# Patient Record
Sex: Male | Born: 1980 | Race: Black or African American | Hispanic: No | Marital: Married | Smoking: Current every day smoker
Health system: Southern US, Community
[De-identification: ages and names within clinical notes are randomized; demographics above are authoritative.]

## PROBLEM LIST (undated history)

## (undated) DIAGNOSIS — J302 Other seasonal allergic rhinitis: Secondary | ICD-10-CM

## (undated) HISTORY — PX: NOSE SURGERY: SHX723

---

## 2004-10-12 ENCOUNTER — Emergency Department: Payer: Self-pay | Admitting: Emergency Medicine

## 2016-12-02 ENCOUNTER — Encounter: Payer: Self-pay | Admitting: Emergency Medicine

## 2016-12-02 ENCOUNTER — Emergency Department
Admission: EM | Admit: 2016-12-02 | Discharge: 2016-12-02 | Disposition: A | Discharge status: Home / Self Care | Payer: Self-pay | Attending: Emergency Medicine | Admitting: Emergency Medicine

## 2016-12-02 DIAGNOSIS — K047 Periapical abscess without sinus: Secondary | ICD-10-CM | POA: Insufficient documentation

## 2016-12-02 DIAGNOSIS — F17210 Nicotine dependence, cigarettes, uncomplicated: Secondary | ICD-10-CM | POA: Insufficient documentation

## 2016-12-02 DIAGNOSIS — K029 Dental caries, unspecified: Secondary | ICD-10-CM | POA: Insufficient documentation

## 2016-12-02 HISTORY — DX: Other seasonal allergic rhinitis: J30.2

## 2016-12-02 MED ORDER — PENICILLIN V POTASSIUM 500 MG PO TABS: 500.0000 mg | ORAL_TABLET | Freq: Four times a day (QID) | ORAL | 0 refills | Status: DC

## 2016-12-02 MED ORDER — TRAMADOL HCL 50 MG PO TABS: 50.0000 mg | ORAL_TABLET | Freq: Two times a day (BID) | ORAL | 0 refills | Status: AC

## 2016-12-02 MED ORDER — TRAMADOL HCL 50 MG PO TABS
50.0000 mg | ORAL_TABLET | Freq: Once | ORAL | Status: AC
  Administered 2016-12-02: 50 mg via ORAL
  Filled 2016-12-02: qty 1

## 2016-12-02 MED ORDER — PENICILLIN V POTASSIUM 500 MG PO TABS
500.0000 mg | ORAL_TABLET | Freq: Once | ORAL | Status: AC
  Administered 2016-12-02: 500 mg via ORAL
  Filled 2016-12-02: qty 1

## 2016-12-02 NOTE — ED Provider Notes (Signed)
[facility] Emergency Department Provider Note ____________________________________________  Time seen: 1833  I have reviewed the triage vital signs and the nursing notes.  HISTORY  Chief Complaint  Otalgia and Jaw Pain   HPI Jerome Stewart is a 36 y.o. male visits to the ED, for evaluation of left ear and jaw pain started 2 days prior. The patient is unclear whether he has an infection in his ear on abscess in his teeth. He does admit to poor dentition, and notes that he has several broken molars on the left lower jaw. He denies any interim fevers, chills, or sweats. He is able to eat and drink without difficulty, but does endorse increased pain to the left jaw and into the ear.  Past Medical History:  Diagnosis Date  . Seasonal allergies     There are no active problems to display for this patient.   Past Surgical History:  Procedure Laterality Date  . NOSE SURGERY      Prior to Admission medications   Medication Sig Start Date End Date Taking? Authorizing Provider  penicillin v potassium (VEETID) 500 MG tablet Take 1 tablet (500 mg total) by mouth 4 (four) times daily. 12/02/16   Phiona Ramnauth V Bacon Dorann Davidson, PA-C  traMADol (ULTRAM) 50 MG tablet Take 1 tablet (50 mg total) by mouth 2 (two) times daily. 12/02/16   Charlesetta Ivory Lani Mendiola, PA-C    Allergies Patient has no known allergies.  No family history on file.  Social History Social History  Substance Use Topics  . Smoking status: Current Every Day Smoker    Packs/day: 1.00  . Smokeless tobacco: Never Used  . Alcohol use No    Review of Systems  Constitutional: Negative for fever. Eyes: Negative for visual changes. ENT: Negative for sore throat. Dental pain on the left. Left otalgia. Cardiovascular: Negative for chest pain. Respiratory: Negative for shortness of breath. Gastrointestinal: Negative for abdominal pain, vomiting and diarrhea. Skin: Negative for rash. Neurological:  Negative for headaches, focal weakness or numbness. ____________________________________________  PHYSICAL EXAM:  VITAL SIGNS: ED Triage Vitals  Enc Vitals Group     BP 12/02/16 1720 139/75     Pulse Rate 12/02/16 1720 65     Resp 12/02/16 1720 18     Temp 12/02/16 1720 98.1 F (36.7 C)     Temp Source 12/02/16 1720 Oral     SpO2 12/02/16 1720 98 %     Weight 12/02/16 1721 175 lb (79.4 kg)     Height 12/02/16 1721 5\' 3"  (1.6 m)     Head Circumference --      Peak Flow --      Pain Score 12/02/16 1721 10     Pain Loc --      Pain Edu? --      Excl. in GC? --     Constitutional: Alert and oriented. Well appearing and in no distress. Head: Normocephalic and atraumatic. Eyes: Conjunctivae are normal. PERRL. Normal extraocular movements Ears: Canals clear. TMs intact bilaterally. No purulent effusion is noted bilaterally. Nose: Dry, erythematous, nasal turbinates. Mild nasal congestion. Dry, purulent nasal discharge noted. No rhinorrhea/epistaxis. Mouth/Throat: Mucous membranes are moist. Uvula is midline and tonsils are flat. No oropharyngeal lesions are appreciated. No sublingual, brawny edema is noted. Her generally poor dentition noted globally. He has large plaque formations at the gumline on the molars. The right lower jaw reveals the second and first molars to be chronically fractured to the gumline. No focal abscesses  appreciated along the buccal mucosa. Neck: Supple. No thyromegaly. Hematological/Lymphatic/Immunological: No cervical lymphadenopathy. Cardiovascular: Normal rate, regular rhythm. Normal distal pulses. Respiratory: Normal respiratory effort. No wheezes/rales/rhonchi. Gastrointestinal: Soft and nontender. No distention. Skin:  Skin is warm, dry and intact. No rash noted. ____________________________________________  PROCEDURES  Pen VK 500 mg PO Ultram 50 mg PO ____________________________________________  INITIAL IMPRESSION / ASSESSMENT AND PLAN / ED  COURSE  Shin with acute pain due to dental caries and presumable dental abscess. He is discharged at this time with prescriptions for Pen-Vee K and Ultram to dose as directed. He is further advised to follow[facility] or dental provider of his choice for definitive management. Return precautions are reviewed. ____________________________________________  FINAL CLINICAL IMPRESSION(S) / ED DIAGNOSES  Final diagnoses:  Pain due to dental caries  Dental abscess      Lissa HoardJenise V Bacon Brandye Inthavong, PA-C 12/03/16 0034    Rockne MenghiniAnne-Caroline Norman, MD 12/07/16 1523

## 2016-12-02 NOTE — ED Triage Notes (Signed)
Pt comes into the ED via POV c/o left ear and jaw pain that started two days ago.  States he is unsure if it is a ear infection or an abscess in his teeth.  Unsure which pain started first.  States he is having pain present in his teeth as well.  Patient in NAD at this time with even and unlabored respirations, neurologically intact, and no difficulty breathing at this time.

## 2016-12-02 NOTE — ED Notes (Signed)
See triage note  States he developed pain to left ear and jaw area about 2-3 days ago. States he is not sure if it is his tooth or his ear  Denies any fever trauma or drainage

## 2016-12-02 NOTE — Discharge Instructions (Signed)
Your exam reveals a likely dental abscess as the cause of your ear & jaw pain. Take the prescription meds as directed. Rinse after every meal with warm salty water. Use a soft toothbrush twice daily. Follow[facility] with your dental provider or [facility]. Return to the ED for worsening symptoms as discussed.

## 2016-12-06 ENCOUNTER — Emergency Department
Admission: EM | Admit: 2016-12-06 | Discharge: 2016-12-06 | Disposition: A | Discharge status: Home / Self Care | Payer: Self-pay | Attending: Emergency Medicine | Admitting: Emergency Medicine

## 2016-12-06 ENCOUNTER — Encounter: Payer: Self-pay | Admitting: Emergency Medicine

## 2016-12-06 DIAGNOSIS — F17210 Nicotine dependence, cigarettes, uncomplicated: Secondary | ICD-10-CM | POA: Insufficient documentation

## 2016-12-06 DIAGNOSIS — K0889 Other specified disorders of teeth and supporting structures: Secondary | ICD-10-CM

## 2016-12-06 DIAGNOSIS — K047 Periapical abscess without sinus: Secondary | ICD-10-CM | POA: Insufficient documentation

## 2016-12-06 DIAGNOSIS — K029 Dental caries, unspecified: Secondary | ICD-10-CM | POA: Insufficient documentation

## 2016-12-06 MED ORDER — CLINDAMYCIN PHOSPHATE 300 MG/2ML IJ SOLN
INTRAMUSCULAR | Status: AC
  Filled 2016-12-06: qty 2

## 2016-12-06 MED ORDER — HYDROCODONE-ACETAMINOPHEN 5-325 MG PO TABS
1.0000 | ORAL_TABLET | ORAL | Status: AC
  Administered 2016-12-06: 1 via ORAL
  Filled 2016-12-06: qty 1

## 2016-12-06 MED ORDER — HYDROCODONE-ACETAMINOPHEN 5-325 MG PO TABS: 1.0000 | ORAL_TABLET | Freq: Four times a day (QID) | ORAL | 0 refills | Status: DC | PRN

## 2016-12-06 MED ORDER — CLINDAMYCIN PHOSPHATE 900 MG/6ML IJ SOLN
600.0000 mg | Freq: Once | INTRAMUSCULAR | Status: AC
  Administered 2016-12-06: 600 mg via INTRAMUSCULAR

## 2016-12-06 MED ORDER — CLINDAMYCIN HCL 150 MG PO CAPS: 300.0000 mg | ORAL_CAPSULE | Freq: Four times a day (QID) | ORAL | 0 refills | Status: AC

## 2016-12-06 NOTE — ED Provider Notes (Signed)
[facility]-EMERGENCY DEPARTMENT Provider Note   CSN: [id number] Arrival date & time: 12/06/16  1958     History   Chief Complaint Chief Complaint  Patient presents with  . Dental Pain    HPI Jerome Stewart is a 36 y.o. male presents to the emergency department for evaluation of left lower dental pain. Patient's had pain for approximately 1 week. Was seen 4 days ago placed on penicillin and tramadol. His pain continues to be moderate to severe. He denies any fevers but has had some facial swelling. No drainage. He is tolerating by mouth well. No headache or neck pain. He has an appointment to see the dentist on 12/14/2016 for dental extraction.  HPI  Past Medical History:  Diagnosis Date  . Seasonal allergies     There are no active problems to display for this patient.   Past Surgical History:  Procedure Laterality Date  . NOSE SURGERY         Home Medications    Prior to Admission medications   Medication Sig Start Date End Date Taking? Authorizing Provider  clindamycin (CLEOCIN) 150 MG capsule Take 2 capsules (300 mg total) by mouth 4 (four) times daily. 12/06/16 12/13/16  Evon Slack, PA-C  HYDROcodone-acetaminophen (NORCO) 5-325 MG tablet Take 1 tablet by mouth every 6 (six) hours as needed for moderate pain. 12/06/16   Evon Slack, PA-C  penicillin v potassium (VEETID) 500 MG tablet Take 1 tablet (500 mg total) by mouth 4 (four) times daily. 12/02/16   Jenise V Bacon Menshew, PA-C  traMADol (ULTRAM) 50 MG tablet Take 1 tablet (50 mg total) by mouth 2 (two) times daily. 12/02/16   Charlesetta Ivory Menshew, PA-C    Family History History reviewed. No pertinent family history.  Social History Social History  Substance Use Topics  . Smoking status: Current Every Day Smoker    Packs/day: 1.00    Types: Cigarettes  . Smokeless tobacco: Never Used  . Alcohol use No     Allergies   Patient has no known allergies.   Review of Systems Review of Systems    Constitutional: Negative.  Negative for activity change, appetite change, chills and fever.  HENT: Positive for dental problem and facial swelling. Negative for congestion, ear pain, mouth sores, rhinorrhea, sinus pressure, sore throat and trouble swallowing.   Eyes: Negative for photophobia, pain and discharge.  Respiratory: Negative for cough, chest tightness and shortness of breath.   Cardiovascular: Negative for chest pain and leg swelling.  Gastrointestinal: Negative for abdominal distention, abdominal pain, diarrhea, nausea and vomiting.  Genitourinary: Negative for difficulty urinating and dysuria.  Musculoskeletal: Negative for arthralgias, back pain and gait problem.  Skin: Negative for color change and rash.  Neurological: Negative for dizziness and headaches.  Hematological: Negative for adenopathy.  Psychiatric/Behavioral: Negative for agitation and behavioral problems.     Physical Exam Updated Vital Signs BP (!) 136/92 (BP Location: Left Arm)   Pulse (!) 103   Temp 99.2 F (37.3 C) (Oral)   Resp 18   Ht 5\' 3"  (1.6 m)   Wt 79.4 kg   SpO2 99%   BMI 31.00 kg/m   Physical Exam  Constitutional: He appears well-developed and well-nourished.  HENT:  Head: Normocephalic and atraumatic.  Right Ear: External ear normal.  Left Ear: External ear normal.  Mouth/Throat: No trismus in the jaw. Abnormal dentition. Dental caries present. No dental abscesses or uvula swelling. No oropharyngeal exudate, posterior oropharyngeal edema, posterior oropharyngeal erythema  or tonsillar abscesses.    No significant facial swelling on examination.  Eyes: Conjunctivae are normal.  Neck: Neck supple.  Cardiovascular: Normal rate and regular rhythm.   No murmur heard. Pulmonary/Chest: Effort normal and breath sounds normal. No respiratory distress.  Abdominal: Soft. There is no tenderness.  Musculoskeletal: He exhibits no edema.  Neurological: He is alert.  Skin: Skin is warm and dry.   Psychiatric: He has a normal mood and affect.  Nursing note and vitals reviewed.    ED Treatments / Results  Labs (all labs ordered are listed, but only abnormal results are displayed) Labs Reviewed - No data to display  EKG  EKG Interpretation None       Radiology No results found.  Procedures Procedures (including critical care time)  Medications Ordered in ED Medications  clindamycin (CLEOCIN) injection 600 mg (not administered)  clindamycin (CLEOCIN) 300 MG/2ML injection (not administered)  HYDROcodone-acetaminophen (NORCO/VICODIN) 5-325 MG per tablet 1 tablet (1 tablet Oral Given 12/06/16 2229)     Initial Impression / Assessment and Plan / ED Course  I have reviewed the triage vital signs and the nursing notes.  Pertinent labs & imaging results that were available during my care of the patient were reviewed by me and considered in my medical decision making (see chart for details).     36 year old male with dental pain. He is put on a stronger antibiotic, clindamycin. His given Norco for pain. He will follow-up with dentist on 12/14/2016. He is educated on signs and symptoms return to the ED for such as fevers, increasing facial swelling, difficulty swallowing.  Final Clinical Impressions(s) / ED Diagnoses   Final diagnoses:  Dental infection  Tooth pain    New Prescriptions New Prescriptions   CLINDAMYCIN (CLEOCIN) 150 MG CAPSULE    Take 2 capsules (300 mg total) by mouth 4 (four) times daily.   HYDROCODONE-ACETAMINOPHEN (NORCO) 5-325 MG TABLET    Take 1 tablet by mouth every 6 (six) hours as needed for moderate pain.     Evon Slackhomas C Tasheika Kitzmiller, PA-C 12/06/16 2235    Jeanmarie PlantJames A McShane, MD 12/07/16 [phone]

## 2016-12-06 NOTE — Discharge Instructions (Signed)
Please take antibiotics as prescribed. [facility] or your dentist in 2-3 days for recheck. Return to the ER for any fevers, Difficulty swallowing, increasing facial swelling,worsening symptoms urgent changes in her health.,

## 2016-12-06 NOTE — ED Notes (Signed)
Patient was seen 3 days ago for dental pain. Pt was discharged with penicillin and tramadol. Pt complains of continued pain and nausea. Pt c/o facial swelling - denies swelling at prior visit. Pt is not able to fully open mouth

## 2016-12-06 NOTE — ED Triage Notes (Signed)
Pt seen her on 12/02/16 and diagnosed with dental abscess;  Placed on antibiotic which pt says he's taking; has appt with his dentist on 12/14/16; pt says today he started having swelling to the left side of his mouth and has a metallic taste in his mouth; swelling noticed to bottom back left side of mouth but no drainage seen at this time; pt talking in complete coherent sentences

## 2016-12-06 NOTE — ED Notes (Signed)

## 2020-05-24 ENCOUNTER — Other Ambulatory Visit: Payer: Self-pay

## 2020-05-24 ENCOUNTER — Ambulatory Visit (LOCAL_COMMUNITY_HEALTH_CENTER): Payer: Self-pay

## 2020-05-24 DIAGNOSIS — Z111 Encounter for screening for respiratory tuberculosis: Secondary | ICD-10-CM

## 2020-05-27 ENCOUNTER — Other Ambulatory Visit: Payer: Self-pay

## 2020-05-27 ENCOUNTER — Ambulatory Visit (LOCAL_COMMUNITY_HEALTH_CENTER): Payer: Self-pay

## 2020-05-27 DIAGNOSIS — Z111 Encounter for screening for respiratory tuberculosis: Secondary | ICD-10-CM

## 2020-05-27 LAB — TB SKIN TEST
Induration: 0 mm
TB Skin Test: NEGATIVE

## 2020-05-27 NOTE — Progress Notes (Signed)
Addended encounter in order to verify diagnosis was documented. Jossie Ng, RN

## 2020-09-02 ENCOUNTER — Emergency Department
Admission: EM | Admit: 2020-09-02 | Discharge: 2020-09-02 | Disposition: A | Discharge status: Home / Self Care | Payer: Self-pay | Attending: Emergency Medicine | Admitting: Emergency Medicine

## 2020-09-02 ENCOUNTER — Other Ambulatory Visit: Payer: Self-pay

## 2020-09-02 ENCOUNTER — Emergency Department: Payer: Self-pay

## 2020-09-02 ENCOUNTER — Encounter: Payer: Self-pay | Admitting: Emergency Medicine

## 2020-09-02 DIAGNOSIS — F17210 Nicotine dependence, cigarettes, uncomplicated: Secondary | ICD-10-CM | POA: Insufficient documentation

## 2020-09-02 DIAGNOSIS — M25511 Pain in right shoulder: Secondary | ICD-10-CM | POA: Insufficient documentation

## 2020-09-02 MED ORDER — METHOCARBAMOL 500 MG PO TABS
500.0000 mg | ORAL_TABLET | Freq: Once | ORAL | Status: AC
  Administered 2020-09-02: 500 mg via ORAL
  Filled 2020-09-02: qty 1

## 2020-09-02 MED ORDER — LIDOCAINE 5 % EX PTCH
1.0000 | MEDICATED_PATCH | CUTANEOUS | Status: DC
  Administered 2020-09-02: 1 via TRANSDERMAL
  Filled 2020-09-02: qty 1

## 2020-09-02 MED ORDER — IBUPROFEN 400 MG PO TABS
400.0000 mg | ORAL_TABLET | Freq: Once | ORAL | Status: AC
  Administered 2020-09-02: 400 mg via ORAL
  Filled 2020-09-02: qty 1

## 2020-09-02 NOTE — ED Notes (Signed)
Pt unable to sign for d/c d/t shoulder sling. Verbalizes understanding of d/c instructions, denies questions or concerns

## 2020-09-02 NOTE — ED Notes (Signed)
Pharmacy messaged for robaxin

## 2020-09-02 NOTE — ED Provider Notes (Signed)
[facility] Emergency Department Provider Note  ____________________________________________   First MD Initiated Contact with Patient 09/02/20 [phone]     (approximate)  I have reviewed the triage vital signs and the nursing notes.   HISTORY  Chief Complaint Shoulder Pain   HPI Jerome Stewart is a 39 y.o. male without significant past medical history presents for assessment of right shoulder pain has been present for approximately 3 days.  Patient denies any precipitating trauma, twisting, or injuries but does note he uses a [employer] for work.  He denies any other acute symptoms including pain in his elbow, wrist, neck or back or left upper extremity or elsewhere.  Surgically denies any fevers skin changes, chest pain, cough, shortness of breath, dental pain, nausea, vomiting, diarrhea, dysuria, rash, or other acute complaints.  Denies any illegal drug use.  He states he took an over-the-counter "arthritis medicine" but does not know what it was.         Past Medical History:  Diagnosis Date  . Seasonal allergies     There are no problems to display for this patient.   Past Surgical History:  Procedure Laterality Date  . NOSE SURGERY      Prior to Admission medications   Medication Sig Start Date End Date Taking? Authorizing Provider  HYDROcodone-acetaminophen (NORCO) 5-325 MG tablet Take 1 tablet by mouth every 6 (six) hours as needed for moderate pain. Patient not taking: Reported on 05/24/2020 12/06/16   Evon Slack, PA-C  penicillin v potassium (VEETID) 500 MG tablet Take 1 tablet (500 mg total) by mouth 4 (four) times daily. Patient not taking: Reported on 05/24/2020 12/02/16   Menshew, Charlesetta Ivory, PA-C  traMADol (ULTRAM) 50 MG tablet Take 1 tablet (50 mg total) by mouth 2 (two) times daily. Patient not taking: Reported on 05/24/2020 12/02/16   Menshew, Charlesetta Ivory, PA-C    Allergies Patient has no known allergies.  History  reviewed. No pertinent family history.  Social History Social History   Tobacco Use  . Smoking status: Current Every Day Smoker    Packs/day: 1.00    Types: Cigarettes  . Smokeless tobacco: Never Used  Substance Use Topics  . Alcohol use: No  . Drug use: No    Review of Systems  Review of Systems  Constitutional: Negative for chills and fever.  HENT: Negative for sore throat.   Eyes: Negative for pain.  Respiratory: Negative for cough and stridor.   Cardiovascular: Negative for chest pain.  Gastrointestinal: Negative for vomiting.  Musculoskeletal: Positive for joint pain ( R shoulder) and myalgias ( R shoulder).  Skin: Negative for rash.  Neurological: Negative for seizures, loss of consciousness and headaches.  Psychiatric/Behavioral: Negative for suicidal ideas.  All other systems reviewed and are negative.     ____________________________________________   PHYSICAL EXAM:  VITAL SIGNS: ED Triage Vitals [09/02/20 0842]  Enc Vitals Group     BP 112/73     Pulse Rate 65     Resp 18     Temp 98.1 F (36.7 C)     Temp Source Oral     SpO2 97 %     Weight 195 lb (88.5 kg)     Height 5\' 3"  (1.6 m)     Head Circumference      Peak Flow      Pain Score 7     Pain Loc      Pain Edu?  Excl. in GC?    Vitals:   09/02/20 0842  BP: 112/73  Pulse: 65  Resp: 18  Temp: 98.1 F (36.7 C)  SpO2: 97%   Physical Exam Vitals and nursing note reviewed.  Constitutional:      Appearance: He is well-developed.  HENT:     Head: Normocephalic and atraumatic.     Right Ear: External ear normal.     Left Ear: External ear normal.     Nose: Nose normal.  Eyes:     Conjunctiva/sclera: Conjunctivae normal.  Cardiovascular:     Rate and Rhythm: Normal rate and regular rhythm.     Heart sounds: No murmur heard.   Pulmonary:     Effort: Pulmonary effort is normal. No respiratory distress.     Breath sounds: Normal breath sounds.  Abdominal:     Palpations:  Abdomen is soft.     Tenderness: There is no abdominal tenderness.  Musculoskeletal:     Cervical back: Neck supple.  Skin:    General: Skin is warm and dry.  Neurological:     Mental Status: He is alert and oriented to person, place, and time.  Psychiatric:        Mood and Affect: Mood normal.     2+ bilateral radial pulses.  Sensation is intact in the bilateral upper extremities and distribution of the radial ulnar median and axillary nerves.  Patient has full strength throughout his left upper extremity and throughout the right upper extremity with the exception of shoulder abduction and external rotation.  He does have some tenderness over the trapezius and posterior shoulder.  There is no effusion or deformity.  No overlying skin changes including erythema, edema, fluctuance, or warmth.  Patient is able to abduct the arm above 90 degrees. ____________________________________________   LABS (all labs ordered are listed, but only abnormal results are displayed)  Labs Reviewed - No data to display ____________________________________________  ____________________________________________  RADIOLOGY  ED MD interpretation: No acute fracture or dislocation.  Official radiology report(s): DG Shoulder Right  Result Date: 09/02/2020 CLINICAL DATA:  Right shoulder pain for 3 days.  No known injury. EXAM: RIGHT SHOULDER - 2+ VIEW COMPARISON:  None. FINDINGS: There is no evidence of fracture or dislocation. There is no evidence of arthropathy or other focal bone abnormality. Soft tissues are unremarkable. IMPRESSION: Normal exam. Electronically Signed   By: Drusilla Kanner M.D.   On: 09/02/2020 09:35    ____________________________________________   PROCEDURES  Procedure(s) performed (including Critical Care):  Procedures   ____________________________________________   INITIAL IMPRESSION / ASSESSMENT AND PLAN / ED COURSE        Patient presents above-stated exam for  assessment of nontraumatic right shoulder pain x3 days.  Patient is afebrile hemodynamically stable arrival.  Exam as above remarkable for right upper extremity that is neurovascular intact without any overlying skin changes at the right shoulder.  He is a little bit of pain on range of motion and cannot abduct quite as high as on the left side but is able to abduct.  No evidence of fracture dislocation x-ray.  Very low suspicion for septic joint with absence of fever or overlying skin changes or pain on passive range of motion.  Given some tenderness as noted above likely ligamentous versus muscle skeletal injury.  Very low suspicion for acute infectious process or neurovascular injury.  Patient given below noted analgesia and discharged stable condition with instructions to follow-up with PCP.  Sling provided.   ____________________________________________  FINAL CLINICAL IMPRESSION(S) / ED DIAGNOSES  Final diagnoses:  Acute pain of right shoulder    Medications  lidocaine (LIDODERM) 5 % 1 patch (1 patch Transdermal Patch Applied 09/02/20 0858)  methocarbamol (ROBAXIN) tablet 500 mg (has no administration in time range)  ibuprofen (ADVIL) tablet 400 mg (400 mg Oral Given 09/02/20 0857)     ED Discharge Orders    None       Note:  This document was prepared using Dragon voice recognition software and may include unintentional dictation errors.   Gilles Chiquito, MD 09/02/20 [phone]

## 2020-09-02 NOTE — ED Notes (Signed)
See triage note, pt reports right shoulder pain x3 days, denies injury. Ambulatory to treatment room

## 2020-09-02 NOTE — ED Triage Notes (Signed)
Pt to ER with c/o right shoulder pain for last 3 days.  Pt denies injury.  States today pain is radiating up into neck.

## 2021-10-30 IMAGING — CR DG SHOULDER 2+V*R*
3 series · 3 of 3 positions shown · non-contrast
Comparison: None.

CLINICAL DATA: Right shoulder pain for 3 days.  No known injury.

EXAM:
RIGHT SHOULDER - 2+ VIEW

[shoulder grashey]
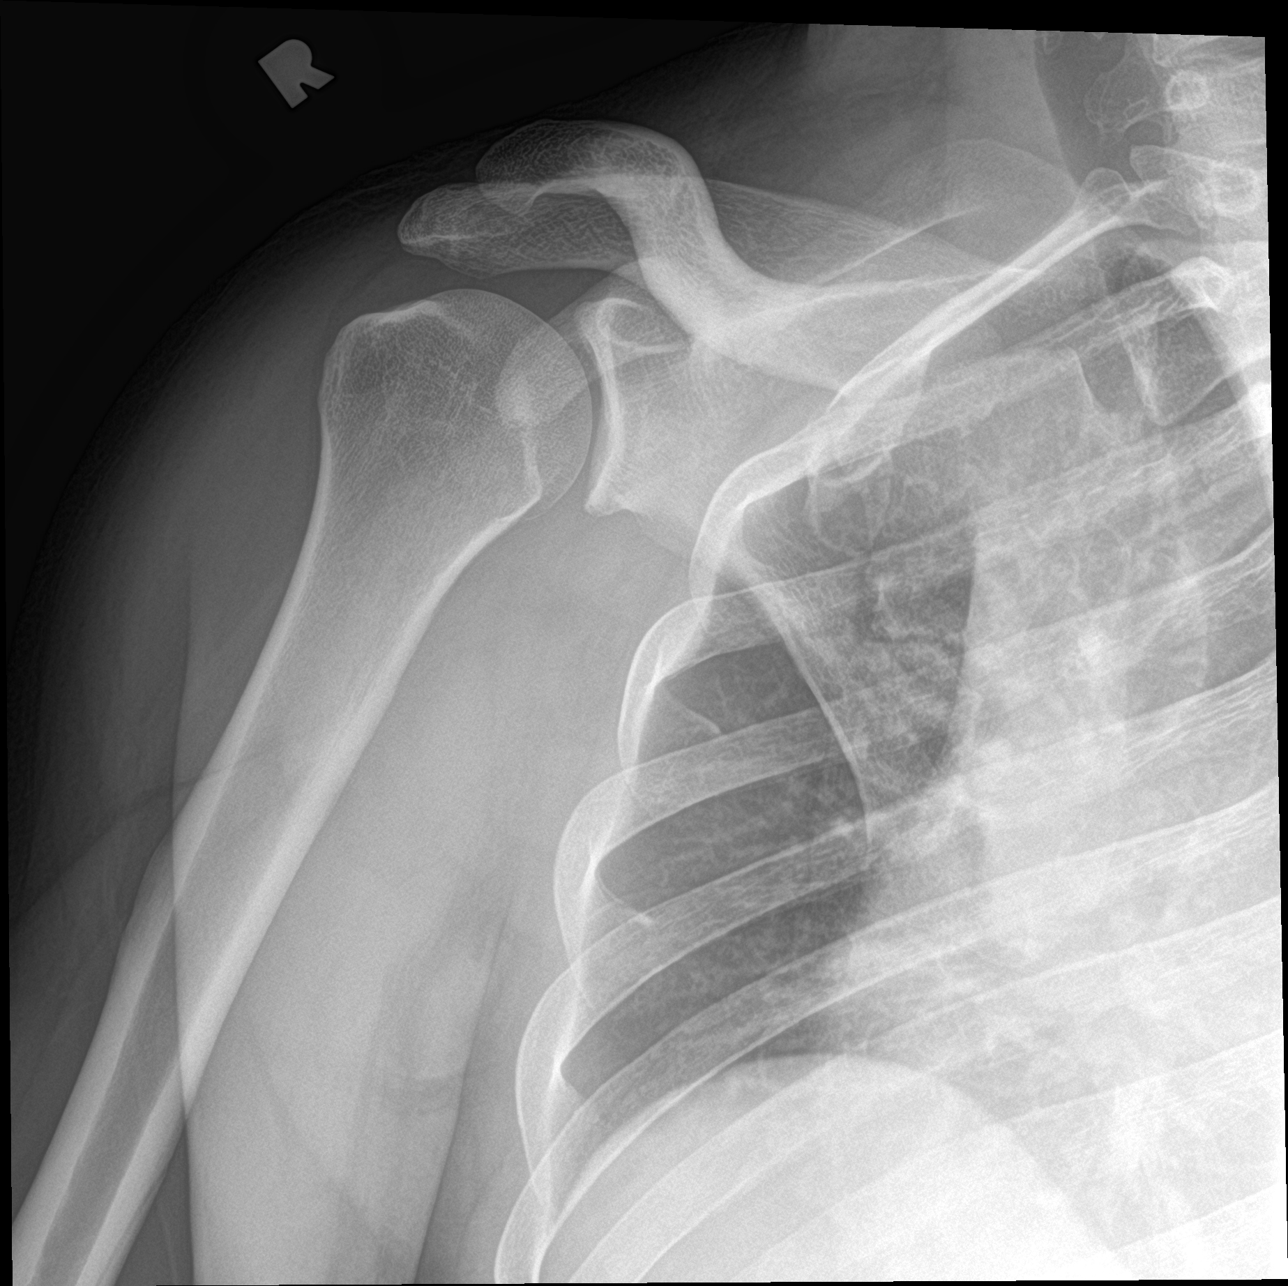

[shoulder y view]
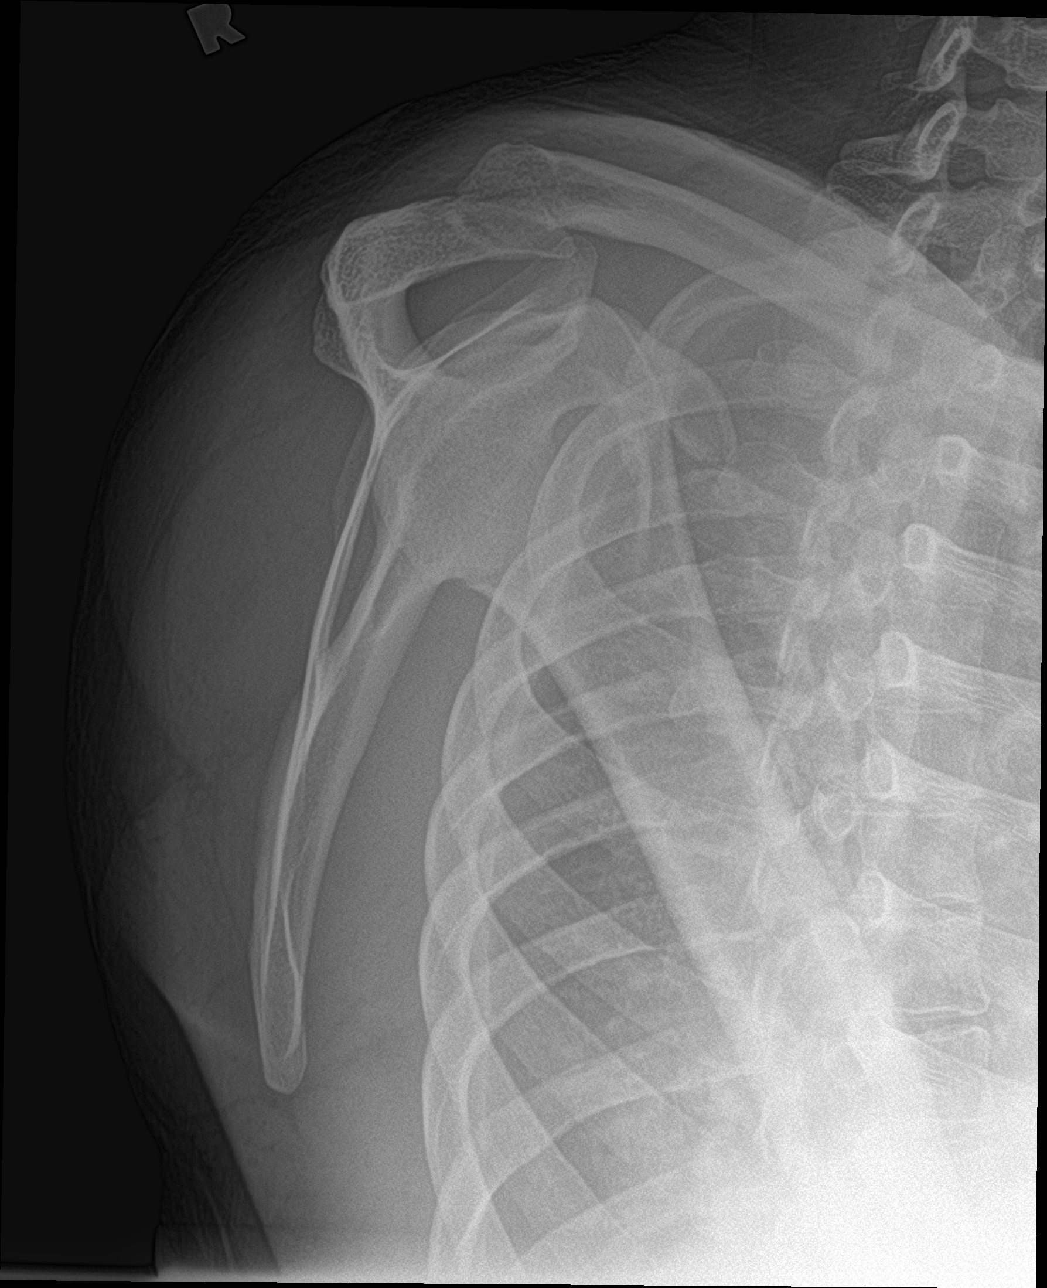

[shoulder axillary]
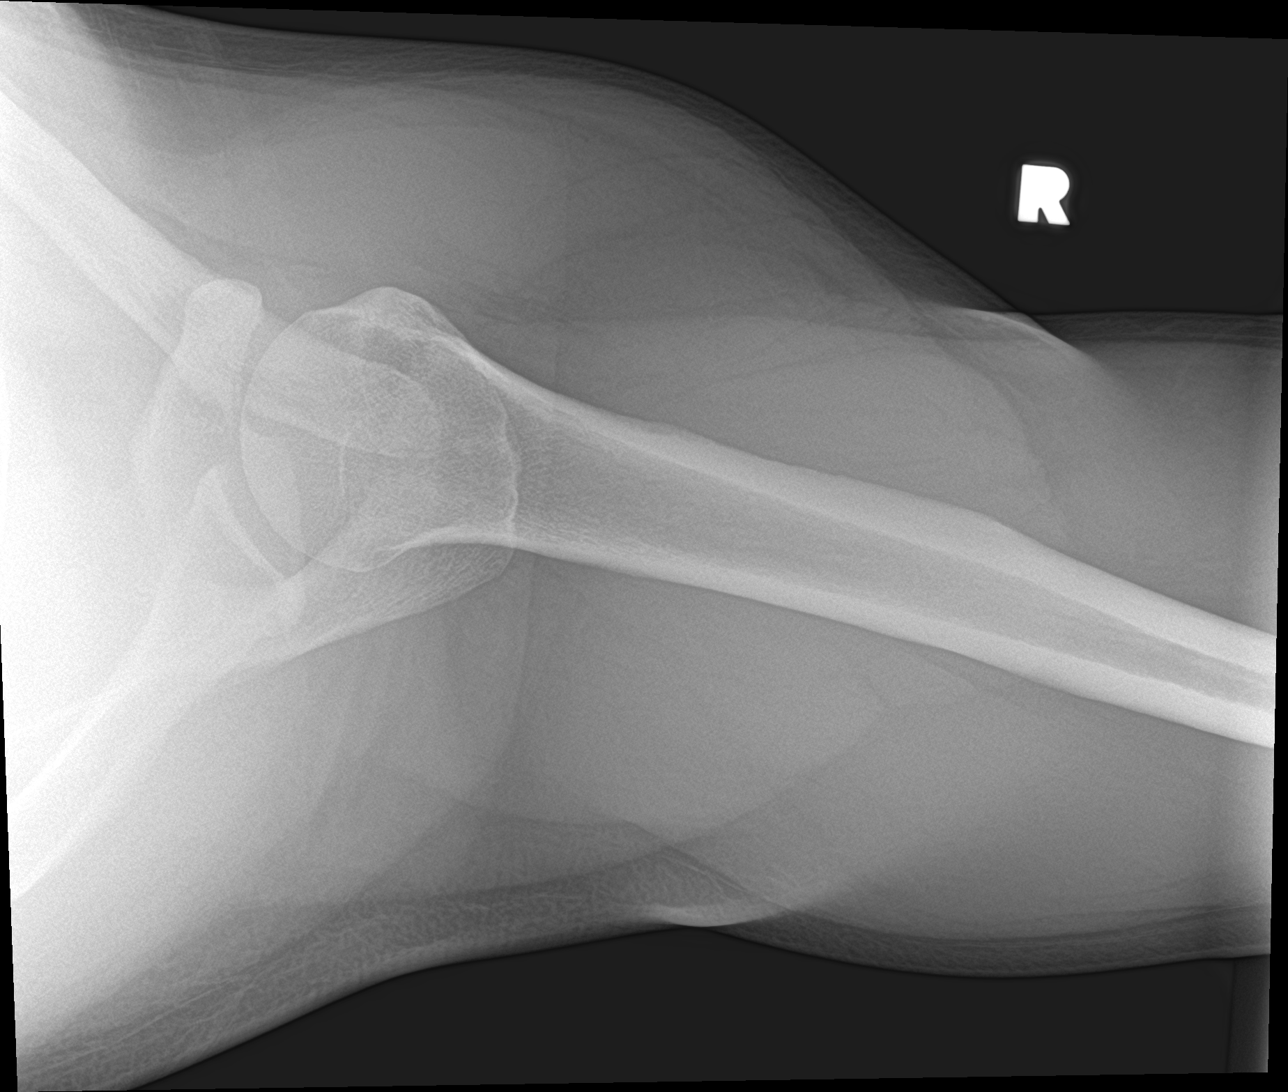

[3 of 3 positions shown; findings below may reference images not displayed]

FINDINGS: There is no evidence of fracture or dislocation. There is no
evidence of arthropathy or other focal bone abnormality. Soft
tissues are unremarkable.
IMPRESSION: Normal exam.

## 2021-11-13 ENCOUNTER — Encounter (HOSPITAL_COMMUNITY): Payer: Self-pay | Admitting: *Deleted

## 2021-11-13 ENCOUNTER — Emergency Department (HOSPITAL_COMMUNITY)
Admission: EM | Admit: 2021-11-13 | Discharge: 2021-11-13 | Disposition: A | Discharge status: Home / Self Care | Payer: Self-pay | Attending: Emergency Medicine | Admitting: Emergency Medicine

## 2021-11-13 DIAGNOSIS — K029 Dental caries, unspecified: Secondary | ICD-10-CM | POA: Insufficient documentation

## 2021-11-13 DIAGNOSIS — K0889 Other specified disorders of teeth and supporting structures: Secondary | ICD-10-CM

## 2021-11-13 MED ORDER — OXYCODONE-ACETAMINOPHEN 5-325 MG PO TABS: 1.0000 | ORAL_TABLET | Freq: Four times a day (QID) | ORAL | 0 refills | Status: AC | PRN

## 2021-11-13 MED ORDER — LIDOCAINE VISCOUS HCL 2 % MT SOLN: 15.0000 mL | Freq: Four times a day (QID) | OROMUCOSAL | 0 refills | Status: AC | PRN

## 2021-11-13 MED ORDER — LIDOCAINE VISCOUS HCL 2 % MT SOLN
15.0000 mL | Freq: Once | OROMUCOSAL | Status: AC
  Administered 2021-11-13: 15 mL via OROMUCOSAL
  Filled 2021-11-13: qty 15

## 2021-11-13 MED ORDER — AMOXICILLIN-POT CLAVULANATE 875-125 MG PO TABS: 1.0000 | ORAL_TABLET | Freq: Two times a day (BID) | ORAL | 0 refills | Status: AC

## 2021-11-13 MED ORDER — OXYCODONE-ACETAMINOPHEN 5-325 MG PO TABS
1.0000 | ORAL_TABLET | Freq: Once | ORAL | Status: AC
  Administered 2021-11-13: 1 via ORAL
  Filled 2021-11-13: qty 1

## 2021-11-13 NOTE — ED Provider Notes (Signed)
[facility] EMERGENCY DEPARTMENT Provider Note   CSN: [id number] Arrival date & time: 11/13/21  1235     History  Chief Complaint  Patient presents with   Cough   Dental Pain    Jerome Stewart is a 41 y.o. male with no significant past medical history.  Presents to the emergency department with a chief complaint of dental pain.  Patient reports that he has been dealing with this pain on and off for multiple years.  Pain has been constant for the last 3 weeks.  Pain is located to the bottom left corner of his mouth.  Patient rates pain 10/10 on the pain scale.  Patient reports pain is worse with touch.  Patient has tried Tylenol, ibuprofen, and clove oil with no relief of symptoms.  Denies any fevers, chills, facial swelling, trouble swallowing, trismus, observe voice, difficulty breathing.  Denies any oral trauma.   Dental Pain Associated symptoms: no drooling, no fever, no headaches and no neck pain       Home Medications Prior to Admission medications   Medication Sig Start Date End Date Taking? Authorizing Provider  HYDROcodone-acetaminophen (NORCO) 5-325 MG tablet Take 1 tablet by mouth every 6 (six) hours as needed for moderate pain. Patient not taking: Reported on 05/24/2020 12/06/16   Evon Slack, PA-C  penicillin v potassium (VEETID) 500 MG tablet Take 1 tablet (500 mg total) by mouth 4 (four) times daily. Patient not taking: Reported on 05/24/2020 12/02/16   Menshew, Charlesetta Ivory, PA-C  traMADol (ULTRAM) 50 MG tablet Take 1 tablet (50 mg total) by mouth 2 (two) times daily. Patient not taking: Reported on 05/24/2020 12/02/16   Menshew, Charlesetta Ivory, PA-C      Allergies    Patient has no known allergies.    Review of Systems   Review of Systems  Constitutional:  Negative for chills and fever.  HENT:  Positive for dental problem. Negative for drooling, sore throat, trouble swallowing and voice change.   Respiratory:  Negative for cough and shortness of breath.    Gastrointestinal:  Negative for nausea and vomiting.  Musculoskeletal:  Negative for neck pain and neck stiffness.  Skin:  Negative for color change and rash.  Allergic/Immunologic: Negative for immunocompromised state.  Neurological:  Negative for dizziness, syncope, light-headedness and headaches.  Psychiatric/Behavioral:  Negative for confusion.    Physical Exam Updated Vital Signs BP 124/76    Pulse 70    Temp 98.2 F (36.8 C) (Oral)    Resp 20    Wt 88.1 kg    SpO2 97%    BMI 34.41 kg/m  Physical Exam Vitals and nursing note reviewed.  Constitutional:      General: He is not in acute distress.    Appearance: He is not ill-appearing, toxic-appearing or diaphoretic.  HENT:     Head: Normocephalic and atraumatic.     Comments: No facial swelling or tenderness.    Mouth/Throat:     Lips: Pink. No lesions.     Mouth: Mucous membranes are moist.     Dentition: Abnormal dentition. Dental tenderness and dental caries present. No gingival swelling or dental abscesses.     Tongue: No lesions. Tongue does not deviate from midline.     Palate: No mass and lesions.     Pharynx: Oropharynx is clear. Uvula midline. No pharyngeal swelling, oropharyngeal exudate, posterior oropharyngeal erythema or uvula swelling.      Comments: Patient has multiple missing teeth and dental  caries.  Teeth noted above are broken with significant dental caries.  No surrounding oral lesions, abscess, or gingival swelling tenderness to noted above teeth.  Handles oral secretions without difficulty Eyes:     General: No scleral icterus.       Right eye: No discharge.        Left eye: No discharge.  Cardiovascular:     Rate and Rhythm: Normal rate.  Pulmonary:     Effort: Pulmonary effort is normal.  Musculoskeletal:     Cervical back: Normal range of motion and neck supple. No edema, erythema, signs of trauma, rigidity, torticollis or crepitus. No pain with movement, spinous process tenderness or muscular  tenderness. Normal range of motion.  Skin:    General: Skin is warm and dry.  Neurological:     General: No focal deficit present.     Mental Status: He is alert.     GCS: GCS eye subscore is 4. GCS verbal subscore is 5. GCS motor subscore is 6.  Psychiatric:        Behavior: Behavior is cooperative.    ED Results / Procedures / Treatments   Labs (all labs ordered are listed, but only abnormal results are displayed) Labs Reviewed - No data to display  EKG None  Radiology No results found.  Procedures Procedures    Medications Ordered in ED Medications  oxyCODONE-acetaminophen (PERCOCET/ROXICET) 5-325 MG per tablet 1 tablet (has no administration in time range)  lidocaine (XYLOCAINE) 2 % viscous mouth solution 15 mL (has no administration in time range)    ED Course/ Medical Decision Making/ A&P                           Medical Decision Making Risk Prescription drug management.   Alert 41 year old male in no acute distress, nontoxic-appearing.  Presents emerged part with a chief complaint of dental pain.  Information was obtained from patient.  Past medical history including previous provider notes and PDMP.  Patient has no facial swelling.  Neck is supple with full range of motion.  No dental abscess observed.  Low suspicion for facial abscess at this time.  Patient has significant dental abnormalities with dental caries.  Will prescribe patient with Percocet, viscous lidocaine, and Augmentin.  Patient will need to follow-up with dentist or oral surgeon.  Importance of dental follow-up was stressed to the patient.  Discussed precautions when taking Percocet medication.  Discussed strict return precautions.  Patient is stable for discharge at this time.  Discussed results, findings, treatment and follow up. Patient advised of return precautions. Patient verbalized understanding and agreed with plan.         Final Clinical Impression(s) / ED Diagnoses Final  diagnoses:  None    Rx / DC Orders ED Discharge Orders     None         Haskel Schroeder, PA-C 11/13/21 1413    Bethann Berkshire, MD 11/13/21 [phone]

## 2021-11-13 NOTE — Discharge Instructions (Signed)
You came to the emergency department today to be evaluated for your dental pain.  Your exam was concerning for a dental infection.  Due to this she was started on the antibiotic Augmentin, please take this medication as prescribed.  I have also given you prescription for Percocet and viscous lidocaine to help with your pain.  You may use the viscous lidocaine as often as you need as long as you spit the solution out.  If you swallow the solution please only take it every 6 hours.  It is very important that you follow-up with a dentist or oral surgeon as soon as possible for further evaluation of your teeth.  Today you received medications that may make you sleepy or impair your ability to make decisions.  For the next 24 hours please do not drive, operate heavy machinery, care for a small child with out another adult present, or perform any activities that may cause harm to you or someone else if you were to fall asleep or be impaired.   You are being prescribed a medication which may make you sleepy. Please follow up of listed precautions for at least 24 hours after taking one dose.   You may have diarrhea from the antibiotics.  It is very important that you continue to take the antibiotics even if you get diarrhea unless a medical professional tells you that you may stop taking them.  If you stop too early the bacteria you are being treated for will become stronger and you may need different, more powerful antibiotics that have more side effects and worsening diarrhea.  Please stay well hydrated and consider probiotics as they may decrease the severity of your diarrhea.    Get help right away if: You are unable to open your mouth. You are having trouble breathing or swallowing. You have a fever. You notice that your face, neck, or jaw is swollen.

## 2021-11-13 NOTE — ED Triage Notes (Signed)
Dental pain left lower jaw

## 2021-11-13 NOTE — ED Notes (Signed)
Pt verbalized understanding of no driving and to use caution within 4 hours of taking pain meds due to meds cause drowsiness. Pt and pt's wife made aware that percocet can cause constipation.

## 2021-11-13 NOTE — ED Notes (Signed)
Dental pain for 2 weeks to left lower.  No dentist.

## 2021-11-13 NOTE — ED Notes (Signed)
ED Provider at bedside.
